# Patient Record
Sex: Male | Born: 1950
Health system: Southern US, Community
[De-identification: ages and names within clinical notes are randomized; demographics above are authoritative.]

## PROBLEM LIST (undated history)

## (undated) DIAGNOSIS — I10 Essential (primary) hypertension: Secondary | ICD-10-CM

## (undated) DIAGNOSIS — R0683 Snoring: Secondary | ICD-10-CM

## (undated) DIAGNOSIS — E7800 Pure hypercholesterolemia, unspecified: Secondary | ICD-10-CM

## (undated) HISTORY — DX: Pure hypercholesterolemia, unspecified: E78.00

## (undated) HISTORY — DX: Snoring: R06.83

## (undated) HISTORY — DX: Essential (primary) hypertension: I10

---

## 2015-03-05 DIAGNOSIS — H40013 Open angle with borderline findings, low risk, bilateral: Secondary | ICD-10-CM | POA: Diagnosis not present

## 2015-05-04 DIAGNOSIS — I10 Essential (primary) hypertension: Secondary | ICD-10-CM | POA: Diagnosis not present

## 2015-05-04 DIAGNOSIS — Z125 Encounter for screening for malignant neoplasm of prostate: Secondary | ICD-10-CM | POA: Diagnosis not present

## 2015-05-11 DIAGNOSIS — R5383 Other fatigue: Secondary | ICD-10-CM | POA: Diagnosis not present

## 2015-05-11 DIAGNOSIS — I129 Hypertensive chronic kidney disease with stage 1 through stage 4 chronic kidney disease, or unspecified chronic kidney disease: Secondary | ICD-10-CM | POA: Diagnosis not present

## 2015-05-11 DIAGNOSIS — Z6827 Body mass index (BMI) 27.0-27.9, adult: Secondary | ICD-10-CM | POA: Diagnosis not present

## 2015-05-11 DIAGNOSIS — E782 Mixed hyperlipidemia: Secondary | ICD-10-CM | POA: Diagnosis not present

## 2015-05-11 DIAGNOSIS — I10 Essential (primary) hypertension: Secondary | ICD-10-CM | POA: Diagnosis not present

## 2015-05-11 DIAGNOSIS — Z1389 Encounter for screening for other disorder: Secondary | ICD-10-CM | POA: Diagnosis not present

## 2015-05-11 DIAGNOSIS — N183 Chronic kidney disease, stage 3 (moderate): Secondary | ICD-10-CM | POA: Diagnosis not present

## 2015-05-11 DIAGNOSIS — Z0000 Encounter for general adult medical examination without abnormal findings: Secondary | ICD-10-CM | POA: Diagnosis not present

## 2015-05-11 DIAGNOSIS — E663 Overweight: Secondary | ICD-10-CM | POA: Diagnosis not present

## 2015-05-11 DIAGNOSIS — Z23 Encounter for immunization: Secondary | ICD-10-CM | POA: Diagnosis not present

## 2015-06-30 DIAGNOSIS — D485 Neoplasm of uncertain behavior of skin: Secondary | ICD-10-CM | POA: Diagnosis not present

## 2015-06-30 DIAGNOSIS — C44311 Basal cell carcinoma of skin of nose: Secondary | ICD-10-CM | POA: Diagnosis not present

## 2015-06-30 DIAGNOSIS — D1801 Hemangioma of skin and subcutaneous tissue: Secondary | ICD-10-CM | POA: Diagnosis not present

## 2015-06-30 DIAGNOSIS — L821 Other seborrheic keratosis: Secondary | ICD-10-CM | POA: Diagnosis not present

## 2015-06-30 DIAGNOSIS — Z85828 Personal history of other malignant neoplasm of skin: Secondary | ICD-10-CM | POA: Diagnosis not present

## 2015-08-05 DIAGNOSIS — Z85828 Personal history of other malignant neoplasm of skin: Secondary | ICD-10-CM | POA: Diagnosis not present

## 2015-08-05 DIAGNOSIS — C44311 Basal cell carcinoma of skin of nose: Secondary | ICD-10-CM | POA: Diagnosis not present

## 2015-08-19 DIAGNOSIS — Z4802 Encounter for removal of sutures: Secondary | ICD-10-CM | POA: Diagnosis not present

## 2015-09-07 DIAGNOSIS — H40013 Open angle with borderline findings, low risk, bilateral: Secondary | ICD-10-CM | POA: Diagnosis not present

## 2016-03-10 DIAGNOSIS — L821 Other seborrheic keratosis: Secondary | ICD-10-CM | POA: Diagnosis not present

## 2016-03-10 DIAGNOSIS — D2239 Melanocytic nevi of other parts of face: Secondary | ICD-10-CM | POA: Diagnosis not present

## 2016-03-10 DIAGNOSIS — L570 Actinic keratosis: Secondary | ICD-10-CM | POA: Diagnosis not present

## 2016-03-10 DIAGNOSIS — D1801 Hemangioma of skin and subcutaneous tissue: Secondary | ICD-10-CM | POA: Diagnosis not present

## 2016-03-10 DIAGNOSIS — D485 Neoplasm of uncertain behavior of skin: Secondary | ICD-10-CM | POA: Diagnosis not present

## 2016-03-10 DIAGNOSIS — Z85828 Personal history of other malignant neoplasm of skin: Secondary | ICD-10-CM | POA: Diagnosis not present

## 2016-03-14 DIAGNOSIS — H2513 Age-related nuclear cataract, bilateral: Secondary | ICD-10-CM | POA: Diagnosis not present

## 2016-03-14 DIAGNOSIS — H40013 Open angle with borderline findings, low risk, bilateral: Secondary | ICD-10-CM | POA: Diagnosis not present

## 2016-06-27 DIAGNOSIS — I10 Essential (primary) hypertension: Secondary | ICD-10-CM | POA: Diagnosis not present

## 2016-06-27 DIAGNOSIS — Z125 Encounter for screening for malignant neoplasm of prostate: Secondary | ICD-10-CM | POA: Diagnosis not present

## 2016-06-27 DIAGNOSIS — E782 Mixed hyperlipidemia: Secondary | ICD-10-CM | POA: Diagnosis not present

## 2016-06-27 DIAGNOSIS — Z79899 Other long term (current) drug therapy: Secondary | ICD-10-CM | POA: Diagnosis not present

## 2016-07-04 DIAGNOSIS — M25473 Effusion, unspecified ankle: Secondary | ICD-10-CM | POA: Diagnosis not present

## 2016-07-04 DIAGNOSIS — E663 Overweight: Secondary | ICD-10-CM | POA: Diagnosis not present

## 2016-07-04 DIAGNOSIS — I10 Essential (primary) hypertension: Secondary | ICD-10-CM | POA: Diagnosis not present

## 2016-07-04 DIAGNOSIS — Z6829 Body mass index (BMI) 29.0-29.9, adult: Secondary | ICD-10-CM | POA: Diagnosis not present

## 2016-07-04 DIAGNOSIS — I129 Hypertensive chronic kidney disease with stage 1 through stage 4 chronic kidney disease, or unspecified chronic kidney disease: Secondary | ICD-10-CM | POA: Diagnosis not present

## 2016-07-04 DIAGNOSIS — Z23 Encounter for immunization: Secondary | ICD-10-CM | POA: Diagnosis not present

## 2016-07-04 DIAGNOSIS — E782 Mixed hyperlipidemia: Secondary | ICD-10-CM | POA: Diagnosis not present

## 2016-07-04 DIAGNOSIS — R5383 Other fatigue: Secondary | ICD-10-CM | POA: Diagnosis not present

## 2016-07-04 DIAGNOSIS — Z1389 Encounter for screening for other disorder: Secondary | ICD-10-CM | POA: Diagnosis not present

## 2016-07-04 DIAGNOSIS — N183 Chronic kidney disease, stage 3 (moderate): Secondary | ICD-10-CM | POA: Diagnosis not present

## 2016-07-04 DIAGNOSIS — Z0000 Encounter for general adult medical examination without abnormal findings: Secondary | ICD-10-CM | POA: Diagnosis not present

## 2016-09-29 DIAGNOSIS — H40013 Open angle with borderline findings, low risk, bilateral: Secondary | ICD-10-CM | POA: Diagnosis not present

## 2016-12-21 DIAGNOSIS — Z6829 Body mass index (BMI) 29.0-29.9, adult: Secondary | ICD-10-CM | POA: Diagnosis not present

## 2016-12-21 DIAGNOSIS — I10 Essential (primary) hypertension: Secondary | ICD-10-CM | POA: Diagnosis not present

## 2016-12-21 DIAGNOSIS — R05 Cough: Secondary | ICD-10-CM | POA: Diagnosis not present

## 2017-03-14 DIAGNOSIS — D485 Neoplasm of uncertain behavior of skin: Secondary | ICD-10-CM | POA: Diagnosis not present

## 2017-03-14 DIAGNOSIS — C44319 Basal cell carcinoma of skin of other parts of face: Secondary | ICD-10-CM | POA: Diagnosis not present

## 2017-03-14 DIAGNOSIS — L570 Actinic keratosis: Secondary | ICD-10-CM | POA: Diagnosis not present

## 2017-03-14 DIAGNOSIS — L821 Other seborrheic keratosis: Secondary | ICD-10-CM | POA: Diagnosis not present

## 2017-03-14 DIAGNOSIS — Z85828 Personal history of other malignant neoplasm of skin: Secondary | ICD-10-CM | POA: Diagnosis not present

## 2017-04-24 DIAGNOSIS — Z85828 Personal history of other malignant neoplasm of skin: Secondary | ICD-10-CM | POA: Diagnosis not present

## 2017-04-24 DIAGNOSIS — C44319 Basal cell carcinoma of skin of other parts of face: Secondary | ICD-10-CM | POA: Diagnosis not present

## 2017-04-27 DIAGNOSIS — M25561 Pain in right knee: Secondary | ICD-10-CM | POA: Diagnosis not present

## 2017-07-05 DIAGNOSIS — Z125 Encounter for screening for malignant neoplasm of prostate: Secondary | ICD-10-CM | POA: Diagnosis not present

## 2017-07-05 DIAGNOSIS — I10 Essential (primary) hypertension: Secondary | ICD-10-CM | POA: Diagnosis not present

## 2017-07-05 DIAGNOSIS — R82998 Other abnormal findings in urine: Secondary | ICD-10-CM | POA: Diagnosis not present

## 2017-07-05 DIAGNOSIS — E782 Mixed hyperlipidemia: Secondary | ICD-10-CM | POA: Diagnosis not present

## 2017-07-12 DIAGNOSIS — M25561 Pain in right knee: Secondary | ICD-10-CM | POA: Diagnosis not present

## 2017-07-12 DIAGNOSIS — I10 Essential (primary) hypertension: Secondary | ICD-10-CM | POA: Diagnosis not present

## 2017-07-12 DIAGNOSIS — E663 Overweight: Secondary | ICD-10-CM | POA: Diagnosis not present

## 2017-07-12 DIAGNOSIS — Z0000 Encounter for general adult medical examination without abnormal findings: Secondary | ICD-10-CM | POA: Diagnosis not present

## 2017-07-12 DIAGNOSIS — Z1389 Encounter for screening for other disorder: Secondary | ICD-10-CM | POA: Diagnosis not present

## 2017-07-12 DIAGNOSIS — E782 Mixed hyperlipidemia: Secondary | ICD-10-CM | POA: Diagnosis not present

## 2017-07-12 DIAGNOSIS — M7731 Calcaneal spur, right foot: Secondary | ICD-10-CM | POA: Diagnosis not present

## 2017-07-12 DIAGNOSIS — Z23 Encounter for immunization: Secondary | ICD-10-CM | POA: Diagnosis not present

## 2017-07-12 DIAGNOSIS — Z6828 Body mass index (BMI) 28.0-28.9, adult: Secondary | ICD-10-CM | POA: Diagnosis not present

## 2017-07-12 DIAGNOSIS — I129 Hypertensive chronic kidney disease with stage 1 through stage 4 chronic kidney disease, or unspecified chronic kidney disease: Secondary | ICD-10-CM | POA: Diagnosis not present

## 2017-07-12 DIAGNOSIS — N183 Chronic kidney disease, stage 3 (moderate): Secondary | ICD-10-CM | POA: Diagnosis not present

## 2018-01-15 DIAGNOSIS — L570 Actinic keratosis: Secondary | ICD-10-CM | POA: Diagnosis not present

## 2018-01-15 DIAGNOSIS — L821 Other seborrheic keratosis: Secondary | ICD-10-CM | POA: Diagnosis not present

## 2018-01-15 DIAGNOSIS — Z85828 Personal history of other malignant neoplasm of skin: Secondary | ICD-10-CM | POA: Diagnosis not present

## 2018-01-15 DIAGNOSIS — L814 Other melanin hyperpigmentation: Secondary | ICD-10-CM | POA: Diagnosis not present

## 2018-04-17 DIAGNOSIS — H25013 Cortical age-related cataract, bilateral: Secondary | ICD-10-CM | POA: Diagnosis not present

## 2018-04-17 DIAGNOSIS — H2513 Age-related nuclear cataract, bilateral: Secondary | ICD-10-CM | POA: Diagnosis not present

## 2018-04-17 DIAGNOSIS — H40023 Open angle with borderline findings, high risk, bilateral: Secondary | ICD-10-CM | POA: Diagnosis not present

## 2018-04-17 DIAGNOSIS — H35363 Drusen (degenerative) of macula, bilateral: Secondary | ICD-10-CM | POA: Diagnosis not present

## 2018-07-11 DIAGNOSIS — N183 Chronic kidney disease, stage 3 (moderate): Secondary | ICD-10-CM | POA: Diagnosis not present

## 2018-07-11 DIAGNOSIS — Z125 Encounter for screening for malignant neoplasm of prostate: Secondary | ICD-10-CM | POA: Diagnosis not present

## 2018-07-11 DIAGNOSIS — R82998 Other abnormal findings in urine: Secondary | ICD-10-CM | POA: Diagnosis not present

## 2018-07-11 DIAGNOSIS — E782 Mixed hyperlipidemia: Secondary | ICD-10-CM | POA: Diagnosis not present

## 2018-07-11 DIAGNOSIS — I10 Essential (primary) hypertension: Secondary | ICD-10-CM | POA: Diagnosis not present

## 2018-07-16 DIAGNOSIS — I129 Hypertensive chronic kidney disease with stage 1 through stage 4 chronic kidney disease, or unspecified chronic kidney disease: Secondary | ICD-10-CM | POA: Diagnosis not present

## 2018-07-16 DIAGNOSIS — Z0000 Encounter for general adult medical examination without abnormal findings: Secondary | ICD-10-CM | POA: Diagnosis not present

## 2018-07-16 DIAGNOSIS — E663 Overweight: Secondary | ICD-10-CM | POA: Diagnosis not present

## 2018-07-16 DIAGNOSIS — N183 Chronic kidney disease, stage 3 (moderate): Secondary | ICD-10-CM | POA: Diagnosis not present

## 2018-07-16 DIAGNOSIS — M25561 Pain in right knee: Secondary | ICD-10-CM | POA: Diagnosis not present

## 2018-07-16 DIAGNOSIS — I10 Essential (primary) hypertension: Secondary | ICD-10-CM | POA: Diagnosis not present

## 2018-07-16 DIAGNOSIS — Z1331 Encounter for screening for depression: Secondary | ICD-10-CM | POA: Diagnosis not present

## 2018-07-16 DIAGNOSIS — E782 Mixed hyperlipidemia: Secondary | ICD-10-CM | POA: Diagnosis not present

## 2018-07-16 DIAGNOSIS — M7731 Calcaneal spur, right foot: Secondary | ICD-10-CM | POA: Diagnosis not present

## 2019-01-16 DIAGNOSIS — D1801 Hemangioma of skin and subcutaneous tissue: Secondary | ICD-10-CM | POA: Diagnosis not present

## 2019-01-16 DIAGNOSIS — L570 Actinic keratosis: Secondary | ICD-10-CM | POA: Diagnosis not present

## 2019-01-16 DIAGNOSIS — D485 Neoplasm of uncertain behavior of skin: Secondary | ICD-10-CM | POA: Diagnosis not present

## 2019-01-16 DIAGNOSIS — C44311 Basal cell carcinoma of skin of nose: Secondary | ICD-10-CM | POA: Diagnosis not present

## 2019-01-16 DIAGNOSIS — C44319 Basal cell carcinoma of skin of other parts of face: Secondary | ICD-10-CM | POA: Diagnosis not present

## 2019-01-16 DIAGNOSIS — Z85828 Personal history of other malignant neoplasm of skin: Secondary | ICD-10-CM | POA: Diagnosis not present

## 2019-01-16 DIAGNOSIS — L821 Other seborrheic keratosis: Secondary | ICD-10-CM | POA: Diagnosis not present

## 2019-01-16 DIAGNOSIS — D044 Carcinoma in situ of skin of scalp and neck: Secondary | ICD-10-CM | POA: Diagnosis not present

## 2019-01-16 DIAGNOSIS — C4441 Basal cell carcinoma of skin of scalp and neck: Secondary | ICD-10-CM | POA: Diagnosis not present

## 2019-02-14 DIAGNOSIS — Z85828 Personal history of other malignant neoplasm of skin: Secondary | ICD-10-CM | POA: Diagnosis not present

## 2019-02-14 DIAGNOSIS — C441191 Basal cell carcinoma of skin of left upper eyelid, including canthus: Secondary | ICD-10-CM | POA: Diagnosis not present

## 2019-02-21 DIAGNOSIS — C4441 Basal cell carcinoma of skin of scalp and neck: Secondary | ICD-10-CM | POA: Diagnosis not present

## 2019-02-21 DIAGNOSIS — Z85828 Personal history of other malignant neoplasm of skin: Secondary | ICD-10-CM | POA: Diagnosis not present

## 2019-02-21 DIAGNOSIS — L988 Other specified disorders of the skin and subcutaneous tissue: Secondary | ICD-10-CM | POA: Diagnosis not present

## 2019-03-09 ENCOUNTER — Ambulatory Visit: Payer: PPO | Attending: Internal Medicine

## 2019-03-09 DIAGNOSIS — Z23 Encounter for immunization: Secondary | ICD-10-CM | POA: Insufficient documentation

## 2019-03-09 NOTE — Progress Notes (Signed)
   Covid-19 Vaccination Clinic  Name:  Jeffrey Bentley    MRN: [id number] DOB: 1950/02/23  03/09/2019  Jeffrey Bentley was observed post Covid-19 immunization for 15 minutes without incidence. He was provided with Vaccine Information Sheet and instruction to access the V-Safe system.   Jeffrey Bentley was instructed to call 911 with any severe reactions post vaccine: Marland Kitchen Difficulty breathing  . Swelling of your face and throat  . A fast heartbeat  . A bad rash all over your body  . Dizziness and weakness    Immunizations Administered    Name Date Dose VIS Date Route   Pfizer COVID-19 Vaccine 03/09/2019 11:22 AM 0.3 mL 01/25/2019 Intramuscular   Manufacturer: [place]   Lot: [id number]   [place]: [id number]

## 2019-03-30 ENCOUNTER — Ambulatory Visit: Payer: PPO | Attending: Internal Medicine

## 2019-03-30 DIAGNOSIS — Z23 Encounter for immunization: Secondary | ICD-10-CM

## 2019-03-30 NOTE — Progress Notes (Signed)
   Covid-19 Vaccination Clinic  Name:  Jeffrey Bentley    MRN: [id number] DOB: July 15, 1950  03/30/2019  Mr. Jeffrey Bentley was observed post Covid-19 immunization for 15 minutes without incidence. He was provided with Vaccine Information Sheet and instruction to access the V-Safe system.   Mr. Jeffrey Bentley was instructed to call 911 with any severe reactions post vaccine: Marland Kitchen Difficulty breathing  . Swelling of your face and throat  . A fast heartbeat  . A bad rash all over your body  . Dizziness and weakness    Immunizations Administered    Name Date Dose VIS Date Route   Pfizer COVID-19 Vaccine 03/30/2019  9:31 AM 0.3 mL 01/25/2019 Intramuscular   Manufacturer: [place]   Lot: [id number]   [place]: [id number]

## 2019-04-18 DIAGNOSIS — H25013 Cortical age-related cataract, bilateral: Secondary | ICD-10-CM | POA: Diagnosis not present

## 2019-04-18 DIAGNOSIS — H2513 Age-related nuclear cataract, bilateral: Secondary | ICD-10-CM | POA: Diagnosis not present

## 2019-04-18 DIAGNOSIS — H35363 Drusen (degenerative) of macula, bilateral: Secondary | ICD-10-CM | POA: Diagnosis not present

## 2019-04-18 DIAGNOSIS — H40023 Open angle with borderline findings, high risk, bilateral: Secondary | ICD-10-CM | POA: Diagnosis not present

## 2019-07-18 DIAGNOSIS — E782 Mixed hyperlipidemia: Secondary | ICD-10-CM | POA: Diagnosis not present

## 2019-07-18 DIAGNOSIS — Z125 Encounter for screening for malignant neoplasm of prostate: Secondary | ICD-10-CM | POA: Diagnosis not present

## 2019-07-22 DIAGNOSIS — N183 Chronic kidney disease, stage 3 unspecified: Secondary | ICD-10-CM | POA: Diagnosis not present

## 2019-07-22 DIAGNOSIS — E782 Mixed hyperlipidemia: Secondary | ICD-10-CM | POA: Diagnosis not present

## 2019-07-22 DIAGNOSIS — Z0000 Encounter for general adult medical examination without abnormal findings: Secondary | ICD-10-CM | POA: Diagnosis not present

## 2019-07-22 DIAGNOSIS — Z1331 Encounter for screening for depression: Secondary | ICD-10-CM | POA: Diagnosis not present

## 2019-07-22 DIAGNOSIS — R252 Cramp and spasm: Secondary | ICD-10-CM | POA: Diagnosis not present

## 2019-07-22 DIAGNOSIS — R82998 Other abnormal findings in urine: Secondary | ICD-10-CM | POA: Diagnosis not present

## 2019-07-22 DIAGNOSIS — I129 Hypertensive chronic kidney disease with stage 1 through stage 4 chronic kidney disease, or unspecified chronic kidney disease: Secondary | ICD-10-CM | POA: Diagnosis not present

## 2019-07-22 DIAGNOSIS — E663 Overweight: Secondary | ICD-10-CM | POA: Diagnosis not present

## 2019-12-24 ENCOUNTER — Ambulatory Visit: Payer: PPO

## 2020-01-22 DIAGNOSIS — D485 Neoplasm of uncertain behavior of skin: Secondary | ICD-10-CM | POA: Diagnosis not present

## 2020-01-22 DIAGNOSIS — L280 Lichen simplex chronicus: Secondary | ICD-10-CM | POA: Diagnosis not present

## 2020-01-22 DIAGNOSIS — L821 Other seborrheic keratosis: Secondary | ICD-10-CM | POA: Diagnosis not present

## 2020-01-22 DIAGNOSIS — C44319 Basal cell carcinoma of skin of other parts of face: Secondary | ICD-10-CM | POA: Diagnosis not present

## 2020-01-22 DIAGNOSIS — Z85828 Personal history of other malignant neoplasm of skin: Secondary | ICD-10-CM | POA: Diagnosis not present

## 2020-01-22 DIAGNOSIS — L570 Actinic keratosis: Secondary | ICD-10-CM | POA: Diagnosis not present

## 2020-04-15 DIAGNOSIS — F419 Anxiety disorder, unspecified: Secondary | ICD-10-CM | POA: Diagnosis not present

## 2020-04-15 DIAGNOSIS — Z125 Encounter for screening for malignant neoplasm of prostate: Secondary | ICD-10-CM | POA: Diagnosis not present

## 2020-04-15 DIAGNOSIS — I10 Essential (primary) hypertension: Secondary | ICD-10-CM | POA: Diagnosis not present

## 2020-04-15 DIAGNOSIS — R82998 Other abnormal findings in urine: Secondary | ICD-10-CM | POA: Diagnosis not present

## 2020-04-15 DIAGNOSIS — N1832 Chronic kidney disease, stage 3b: Secondary | ICD-10-CM | POA: Diagnosis not present

## 2020-04-15 DIAGNOSIS — E782 Mixed hyperlipidemia: Secondary | ICD-10-CM | POA: Diagnosis not present

## 2020-04-15 DIAGNOSIS — E663 Overweight: Secondary | ICD-10-CM | POA: Diagnosis not present

## 2020-04-15 DIAGNOSIS — R4586 Emotional lability: Secondary | ICD-10-CM | POA: Diagnosis not present

## 2020-04-15 DIAGNOSIS — I129 Hypertensive chronic kidney disease with stage 1 through stage 4 chronic kidney disease, or unspecified chronic kidney disease: Secondary | ICD-10-CM | POA: Diagnosis not present

## 2020-04-16 ENCOUNTER — Other Ambulatory Visit: Payer: Self-pay | Admitting: Internal Medicine

## 2020-04-16 DIAGNOSIS — R4586 Emotional lability: Secondary | ICD-10-CM

## 2020-04-20 DIAGNOSIS — H25013 Cortical age-related cataract, bilateral: Secondary | ICD-10-CM | POA: Diagnosis not present

## 2020-04-20 DIAGNOSIS — H40023 Open angle with borderline findings, high risk, bilateral: Secondary | ICD-10-CM | POA: Diagnosis not present

## 2020-04-20 DIAGNOSIS — H2513 Age-related nuclear cataract, bilateral: Secondary | ICD-10-CM | POA: Diagnosis not present

## 2020-04-20 DIAGNOSIS — H524 Presbyopia: Secondary | ICD-10-CM | POA: Diagnosis not present

## 2020-04-20 DIAGNOSIS — H35363 Drusen (degenerative) of macula, bilateral: Secondary | ICD-10-CM | POA: Diagnosis not present

## 2020-05-06 ENCOUNTER — Ambulatory Visit
Admission: RE | Admit: 2020-05-06 | Discharge: 2020-05-06 | Disposition: A | Discharge status: Home / Self Care | Payer: PPO | Source: Ambulatory Visit | Attending: Internal Medicine | Admitting: Internal Medicine

## 2020-05-06 DIAGNOSIS — R4586 Emotional lability: Secondary | ICD-10-CM

## 2020-05-06 DIAGNOSIS — R413 Other amnesia: Secondary | ICD-10-CM | POA: Diagnosis not present

## 2020-05-06 DIAGNOSIS — I639 Cerebral infarction, unspecified: Secondary | ICD-10-CM | POA: Diagnosis not present

## 2020-05-06 MED ORDER — GADOBENATE DIMEGLUMINE 529 MG/ML IV SOLN
20.0000 mL | Freq: Once | INTRAVENOUS | Status: AC | PRN
  Administered 2020-05-06: 20 mL via INTRAVENOUS

## 2020-05-20 DIAGNOSIS — R4586 Emotional lability: Secondary | ICD-10-CM | POA: Diagnosis not present

## 2020-05-20 DIAGNOSIS — N1832 Chronic kidney disease, stage 3b: Secondary | ICD-10-CM | POA: Diagnosis not present

## 2020-05-20 DIAGNOSIS — E782 Mixed hyperlipidemia: Secondary | ICD-10-CM | POA: Diagnosis not present

## 2020-05-20 DIAGNOSIS — I129 Hypertensive chronic kidney disease with stage 1 through stage 4 chronic kidney disease, or unspecified chronic kidney disease: Secondary | ICD-10-CM | POA: Diagnosis not present

## 2020-07-02 DIAGNOSIS — Z1152 Encounter for screening for COVID-19: Secondary | ICD-10-CM | POA: Diagnosis not present

## 2020-07-02 DIAGNOSIS — J0110 Acute frontal sinusitis, unspecified: Secondary | ICD-10-CM | POA: Diagnosis not present

## 2020-07-02 DIAGNOSIS — R059 Cough, unspecified: Secondary | ICD-10-CM | POA: Diagnosis not present

## 2020-07-02 DIAGNOSIS — J988 Other specified respiratory disorders: Secondary | ICD-10-CM | POA: Diagnosis not present

## 2020-07-29 DIAGNOSIS — Z1339 Encounter for screening examination for other mental health and behavioral disorders: Secondary | ICD-10-CM | POA: Diagnosis not present

## 2020-07-29 DIAGNOSIS — I129 Hypertensive chronic kidney disease with stage 1 through stage 4 chronic kidney disease, or unspecified chronic kidney disease: Secondary | ICD-10-CM | POA: Diagnosis not present

## 2020-07-29 DIAGNOSIS — N1832 Chronic kidney disease, stage 3b: Secondary | ICD-10-CM | POA: Diagnosis not present

## 2020-07-29 DIAGNOSIS — E663 Overweight: Secondary | ICD-10-CM | POA: Diagnosis not present

## 2020-07-29 DIAGNOSIS — Z1331 Encounter for screening for depression: Secondary | ICD-10-CM | POA: Diagnosis not present

## 2020-07-29 DIAGNOSIS — E782 Mixed hyperlipidemia: Secondary | ICD-10-CM | POA: Diagnosis not present

## 2020-07-29 DIAGNOSIS — Z0000 Encounter for general adult medical examination without abnormal findings: Secondary | ICD-10-CM | POA: Diagnosis not present

## 2020-07-29 DIAGNOSIS — R400 Somnolence: Secondary | ICD-10-CM | POA: Diagnosis not present

## 2020-08-10 ENCOUNTER — Ambulatory Visit: Payer: PPO | Admitting: Neurology

## 2020-08-10 ENCOUNTER — Encounter: Payer: Self-pay | Admitting: Neurology

## 2020-08-10 VITALS — BP 130/84 | HR 89 | Ht 72.0 in | Wt 220.0 lb

## 2020-08-10 DIAGNOSIS — R351 Nocturia: Secondary | ICD-10-CM | POA: Diagnosis not present

## 2020-08-10 DIAGNOSIS — G4733 Obstructive sleep apnea (adult) (pediatric): Secondary | ICD-10-CM

## 2020-08-10 DIAGNOSIS — E663 Overweight: Secondary | ICD-10-CM | POA: Diagnosis not present

## 2020-08-10 DIAGNOSIS — R0681 Apnea, not elsewhere classified: Secondary | ICD-10-CM | POA: Diagnosis not present

## 2020-08-10 NOTE — Patient Instructions (Signed)

## 2020-08-10 NOTE — Progress Notes (Signed)
Subjective:    Patient ID: Jeffrey Bentley is a 70 y.o. male.  HPI    Star Age, MD, PhD [facility] [address] [address] [place], [place] [address]  Dear Dr. Reynaldo Minium,   I saw your patient, Jeffrey Bentley, upon your kind request in my sleep clinic today for initial consultation of his sleep disorder, in particular, concern for underlying obstructive sleep apnea. The patient is unaccompanied today. As you know, Jeffrey Bentley is a 70 year old right-handed gentleman with an underlying medical history of hypertension, hyperlipidemia, chronic kidney disease, and overweight state, who reports snoring and witnessed apneas .  I reviewed your office note from 07/29/2020.  He had blood work through your office for 04/15/2020 and I was able to review the results: CMP was unremarkable with the exception of slightly elevated glucose at 119, CBC with differential unremarkable, lipid panel unremarkable, LDL was 86 and HDL was 38.  TSH normal at 1.23, free T4 normal, PSA normal at 2.482, B12 437, RPR nonreactive.  His Epworth sleepiness score is 5 out of 24, fatigue severity score is 22 out of 63.  He had a home sleep test some 30 years ago and was diagnosed with sleep apnea at the time.  He tried home CPAP therapy.  He could not tolerate the pressure or the mask at the time but does not recall exactly what specific issues he had as it was so long ago.  He reports that his wife still notices apneic pauses while he is asleep.  He tries to be in bed between 1030 and 11 PM and rise time is typically between 630 and 7:30 AM.  He is married and lives with his wife, they have 1 dog in the household.  He has 3 grown children, 1 grandchild and 1 on the way.  He drinks caffeine in the form of coffee, typically 2 cups in the mornings and diet Dr. Malachi Bonds about 3 cups/day currently.  He is in the process of reducing his diet soda intake.  Weight has generally been more or less stable.  He does  not have a family history of sleep apnea.  He has nocturia about once per average night and denies recurrent morning headaches.  He does have a TV in the bedroom but typically does not watch it in his bedroom.  Sometimes he has to sleep in a separate bedroom because the snoring is bothersome to his wife.  Years ago he saw ENT and was told that he had a deviated septum.  Surgery was discussed but not covered by the insurance and he did not pursue it at the time.  He is a non-smoker, he drinks alcohol occasionally.  He is retired as a Tree surgeon.  His  Past Medical History:  Diagnosis Date   High blood pressure    High cholesterol    Snoring     His Past Surgical History Is Significant For: History reviewed. No pertinent surgical history.  His Family History Is Significant For: History reviewed. No pertinent family history.  His Social History Is Significant For: Social History   Socioeconomic History   Marital status: Married    Spouse name: Not on file   Number of children: Not on file   Years of education: Not on file   Highest education level: Not on file  Occupational History   Not on file  Tobacco Use   Smoking status: Former    Pack years: 0.00  Types: Cigarettes   Smokeless tobacco: Never  Substance and Sexual Activity   Alcohol use: Yes    Comment: Seldom   Drug use: Never   Sexual activity: Not on file  Other Topics Concern   Not on file  Social History Narrative   Not on file   Social Determinants of Health   Financial Resource Strain: Not on file  Food Insecurity: Not on file  Transportation Needs: Not on file  Physical Activity: Not on file  Stress: Not on file  Social Connections: Not on file    His Allergies Are:  No Known Allergies:   His Current Medications Are:  Outpatient Encounter Medications as of 08/10/2020  Medication Sig   atorvastatin (LIPITOR) 20 MG tablet Take 20 mg by mouth daily.   losartan (COZAAR) 50 MG tablet Take 50 mg by  mouth 2 (two) times daily.   sertraline (ZOLOFT) 50 MG tablet Take 50 mg by mouth daily.   No facility-administered encounter medications on file as of 08/10/2020.  :   Review of Systems:  Out of a complete 14 point review of systems, all are reviewed and negative with the exception of these symptoms as listed below:  Review of Systems  Neurological:        Here for sleep consult. Sts he had a sleep study 30+ years ago and did try cpap but could not tolerate the mask at night. Snoring is present.  Epworth Sleepiness Scale 0= would never doze 1= slight chance of dozing 2= moderate chance of dozing 3= high chance of dozing  Sitting and reading:1 Watching TV:1 Sitting inactive in a public place (ex. Theater or meeting):0 As a passenger in a car for an hour without a break:0 Lying down to rest in the afternoon:3 Sitting and talking to someone:0 Sitting quietly after lunch (no alcohol):0 In a car, while stopped in traffic:0 Total:5    Objective:  Neurological Exam  Physical Exam Physical Examination:   Vitals:   08/10/20 1451  BP: 130/84  Pulse: 89    General Examination: The patient is a very pleasant 70 y.o. male in no acute distress. He appears well-developed and well-nourished and well groomed.   HEENT: Normocephalic, atraumatic, pupils are equal, round and reactive to light, extraocular tracking is good without limitation to gaze excursion or nystagmus noted. Hearing is grossly intact. Face is symmetric with normal facial animation. Speech is clear with no dysarthria noted. There is no hypophonia. There is no lip, neck/head, jaw or voice tremor. Neck is supple with full range of passive and active motion. There are no carotid bruits on auscultation. Oropharynx exam reveals: mild mouth dryness, adequate dental hygiene and mild airway crowding, due to smaller airway entry and redundant soft palate.  Tonsils on the smaller side.  Tongue protrudes centrally and palate elevates  symmetrically, Mallampati class III.  Neck circumference of 17 inches.  He has a moderate overbite.  Nasal inspection reveals significantly deviated septum to the right.  Chest: Clear to auscultation without wheezing, rhonchi or crackles noted.  Heart: S1+S2+0, regular and normal without murmurs, rubs or gallops noted.   Abdomen: Soft, non-tender and non-distended with normal bowel sounds appreciated on auscultation.  Extremities: There is no pitting edema in the distal lower extremities bilaterally.   Skin: Warm and dry without trophic changes noted.   Musculoskeletal: exam reveals no obvious joint deformities, tenderness or joint swelling or erythema.   Neurologically:  Mental status: The patient is awake, alert and oriented in  all 4 spheres. His immediate and remote memory, attention, language skills and fund of knowledge are appropriate. There is no evidence of aphasia, agnosia, apraxia or anomia. Speech is clear with normal prosody and enunciation. Thought process is linear. Mood is normal and affect is normal.  Cranial nerves II - XII are as described above under HEENT exam.  Motor exam: Normal bulk, strength and tone is noted. There is no tremor, fine motor skills and coordination: grossly intact.  Cerebellar testing: No dysmetria or intention tremor. There is no truncal or gait ataxia.  Sensory exam: intact to light touch in the upper and lower extremities.  Gait, station and balance: He stands easily. No veering to one side is noted. No leaning to one side is noted. Posture is age-appropriate and stance is narrow based. Gait shows normal stride length and normal pace. No problems turning are noted.   Assessment and Plan:  In summary, Jeffrey Bentley is a very pleasant 70 y.o.-year old male with an underlying medical history of hypertension, hyperlipidemia, chronic kidney disease, and overweight state, whose history and physical exam are concerning for obstructive sleep apnea (OSA). I  had a long chat with the patient about my findings and the diagnosis of OSA, its prognosis and treatment options. We talked about medical treatments, surgical interventions and non-pharmacological approaches. I explained in particular the risks and ramifications of untreated moderate to severe OSA, especially with respect to developing cardiovascular disease down the Road, including congestive heart failure, difficult to treat hypertension, cardiac arrhythmias, or stroke. Even type 2 diabetes has, in part, been linked to untreated OSA. Symptoms of untreated OSA include daytime sleepiness, memory problems, mood irritability and mood disorder such as depression and anxiety, lack of energy, as well as recurrent headaches, especially morning headaches. We talked about trying to maintain a healthy lifestyle in general, as well as the importance of weight control. We also talked about the importance of good sleep hygiene. I recommended the following at this time: sleep study.   I explained the sleep test procedure to the patient and also outlined possible surgical and non-surgical treatment options of OSA, including the use of a custom-made dental device (which would require a referral to a specialist dentist or oral surgeon), upper airway surgical options, such as traditional UPPP or a novel less invasive surgical option in the form of Inspire hypoglossal nerve stimulation (which would involve a referral to an ENT surgeon). I also explained the CPAP treatment option to the patient, who indicated that he would be willing to try CPAP if the need arises.  We will pick up our discussion after testing is completed.  We will plan to follow-up according to the test results as well.  I answered all his questions today and he was in agreement with the plan.  Thank you very much for allowing me to participate in the care of this nice patient. If I can be of any further assistance to you please do not hesitate to call me at  [phone].  Sincerely,   Star Age, MD, PhD

## 2020-10-28 ENCOUNTER — Other Ambulatory Visit: Payer: Self-pay

## 2020-10-28 ENCOUNTER — Ambulatory Visit (INDEPENDENT_AMBULATORY_CARE_PROVIDER_SITE_OTHER): Payer: PPO | Admitting: Neurology

## 2020-10-28 DIAGNOSIS — G4720 Circadian rhythm sleep disorder, unspecified type: Secondary | ICD-10-CM

## 2020-10-28 DIAGNOSIS — E663 Overweight: Secondary | ICD-10-CM

## 2020-10-28 DIAGNOSIS — R351 Nocturia: Secondary | ICD-10-CM

## 2020-10-28 DIAGNOSIS — R0681 Apnea, not elsewhere classified: Secondary | ICD-10-CM

## 2020-10-28 DIAGNOSIS — G4733 Obstructive sleep apnea (adult) (pediatric): Secondary | ICD-10-CM | POA: Diagnosis not present

## 2020-11-05 NOTE — Addendum Note (Signed)
Addended by: Star Age on: 11/05/2020 05:07 PM   Modules accepted: Orders

## 2020-11-05 NOTE — Procedures (Signed)
nPATIENT'S NAME:  Jeffrey Bentley, Jeffrey Bentley DOB:      [birth year]      MR#:    [id number]     DATE OF RECORDING: 10/28/2020 REFERRING M.D.:  Burnard Bunting, MD Study Performed:   Baseline Polysomnogram HISTORY: 70 year old man with a history of hypertension, hyperlipidemia, chronic kidney disease, and overweight state, who reports snoring and witnessed apneas. He was diagnosed with sleep apnea many years ago and could not tolerate CPAP at the time. The patient endorsed the Epworth Sleepiness Scale at 5 points. The patient's weight 220 pounds with a height of 72 (inches), resulting in a BMI of 29.9 kg/m2. The patient's neck circumference measured 17 inches.  CURRENT MEDICATIONS: Lipitor, Cozaar, Zoloft   PROCEDURE:  This is a multichannel digital polysomnogram utilizing the Somnostar 11.2 system.  Electrodes and sensors were applied and monitored per AASM Specifications.   EEG, EOG, Chin and Limb EMG, were sampled at 200 Hz.  ECG, Snore and Nasal Pressure, Thermal Airflow, Respiratory Effort, CPAP Flow and Pressure, Oximetry was sampled at 50 Hz. Digital video and audio were recorded.      BASELINE STUDY  Lights Out was at 21:49 and Lights On at 05:22.  Total recording time (TRT) was 453 minutes, with a total sleep time (TST) of 313.5 minutes.   The patient's sleep latency was 41 minutes, which is delayed. REM latency was 193.5 minutes, which is delayed. The sleep efficiency was 69.2%.     SLEEP ARCHITECTURE: WASO (Wake after sleep onset) was 98.5 minutes with moderate sleep fragmentation noted.  There were 66.5 minutes in Stage N1, 171.5 minutes Stage N2, 39.5 minutes Stage N3 and 36 minutes in Stage REM.  The percentage of Stage N1 was 21.2%, which is markedly increased, Stage N2 was 54.7%, which is normal, Stage N3 was 12.6% and Stage R (REM sleep) was 11.5%, which is reduced.  The arousals were noted as: 95 were spontaneous, 0 were associated with PLMs, 63 were associated with respiratory  events.  RESPIRATORY ANALYSIS:  There were a total of 110 respiratory events:  42 obstructive apneas, 0 central apneas and 0 mixed apneas with a total of 42 apneas and an apnea index (AI) of 8. /hour. There were 68 hypopneas with a hypopnea index of 13. /hour. The patient also had 0 respiratory event related arousals (RERAs).      The total APNEA/HYPOPNEA INDEX (AHI) was 21.1/hour and the total RESPIRATORY DISTURBANCE INDEX was  21.1 /hour.  25 events occurred in REM sleep and 102 events in NREM. The REM AHI was  41.7 /hour, versus a non-REM AHI of 18.4. The patient spent 47.5 minutes of total sleep time in the supine position and 266 minutes in non-supine. The supine AHI was 24.0 versus a non-supine AHI of 20.5.  OXYGEN SATURATION & C02:  The Wake baseline 02 saturation was 96%, with the lowest being 81%. Time spent below 89% saturation equaled 36 minutes.  PERIODIC LIMB MOVEMENTS: The patient had a total of 0 Periodic Limb Movements.  The Periodic Limb Movement (PLM) index was 0 and the PLM Arousal index was 0/hour.  Audio and video analysis did not show any abnormal or unusual movements, behaviors, phonations or vocalizations. The patient took 1 bathroom break. Moderate snoring was noted. The EKG was in keeping with normal sinus rhythm (NSR).  Post-study, the patient indicated that sleep was the same as usual.   IMPRESSION:  Obstructive Sleep Apnea (OSA) Dysfunctions associated with sleep stages or arousal from sleep  RECOMMENDATIONS:  This study demonstrates moderate to severe obstructive sleep apnea, with a total AHI of 21.1/hour, REM AHI of 41.7/hour, supine AHI of 24/hour and O2 nadir of 81%. Treatment with positive airway pressure in the form of CPAP is recommended. This will require a full night titration study to optimize therapy. Other treatment options may include surgical options in appropriate patients or the use of an oral appliance in certain patients. ENT evaluation and/or  consultation with a maxillofacial surgeon or dentist may be feasible.    Please note that untreated obstructive sleep apnea carries additional perioperative morbidity. Patients with significant obstructive sleep apnea should receive perioperative PAP therapy and the surgeons and particularly the anesthesiologist should be informed of the diagnosis and the severity of the sleep disordered breathing. This study shows sleep fragmentation and abnormal sleep stage percentages; these are nonspecific findings and per se do not signify an intrinsic sleep disorder or a cause for the patient's sleep-related symptoms. Causes include (but are not limited to) the first night effect of the sleep study, circadian rhythm disturbances, medication effect or an underlying mood disorder or medical problem.  The patient should be cautioned not to drive, work at heights, or operate dangerous or heavy equipment when tired or sleepy. Review and reiteration of good sleep hygiene measures should be pursued with any patient. The patient will be seen in follow-up in the sleep clinic at [facility] for discussion of the test results, symptom and treatment compliance review, further management strategies, etc. The referring provider will be notified of the test results.  I certify that I have reviewed the entire raw data recording prior to the issuance of this report in accordance with the Standards of Accreditation of the American Academy of Sleep Medicine (AASM)  Star Age, MD, PhD Diplomat, American Board of Neurology and Sleep Medicine (Neurology and Sleep Medicine)

## 2020-11-09 ENCOUNTER — Telehealth: Payer: Self-pay | Admitting: *Deleted

## 2020-11-09 NOTE — Telephone Encounter (Signed)
Spoke with patient and discussed sleep study results.  He understands the sleep study showed moderate to severe obstructive sleep apnea and the recommended treatment is with a form of CPAP.  He understands this will require a repeat sleep study for proper titration and mask fitting and current monitoring of patient's patient will await a call to schedule once insurance authorization is complete.

## 2020-11-09 NOTE — Telephone Encounter (Signed)
-----   Message from Star Age, MD sent at 11/05/2020  5:07 PM EDT ----- Patient referred by Dr. Reynaldo Minium, seen by me on 08/10/20, diagnostic PSG on 10/28/20.   Please call and notify the patient that the recent sleep study showed moderate to severe obstructive sleep apnea. I recommend treatment for this in the form of CPAP. This will require a repeat sleep study for proper titration and mask fitting and correct monitoring of the oxygen saturations, especially, since he did not have a good experience with CPAP in the (distant) past. Please explain to patient. I have placed an order in the chart. Thanks.  Star Age, MD, PhD [facility])

## 2020-11-11 ENCOUNTER — Encounter: Payer: Self-pay | Admitting: *Deleted

## 2020-11-11 ENCOUNTER — Telehealth: Payer: Self-pay

## 2020-11-11 DIAGNOSIS — G4733 Obstructive sleep apnea (adult) (pediatric): Secondary | ICD-10-CM

## 2020-11-11 NOTE — Telephone Encounter (Signed)
Called pt on mobile # and LVM (ok per DPR) advising insurance not approving in-lab CPAP titration study at this time but recommends autoPAP at home instead. I left the office number in the message for pt to call back and discuss.

## 2020-11-11 NOTE — Telephone Encounter (Signed)
We will set patient up with autoPAP at home, as insurance denied in house titration study for his moderate to severe OSA. Pls process order and notify patient and set up FU in 10 weeks with me or NP.

## 2020-11-11 NOTE — Addendum Note (Signed)
Addended by: Star Age on: 11/11/2020 12:27 PM   Modules accepted: Orders

## 2020-11-11 NOTE — Telephone Encounter (Signed)
Insurance is not allowing for inlab titration study at this time. [employer] is recommending an AutoPAP for patient first. Then will allow CPAP study if patient has difficulty with autopap.

## 2020-11-11 NOTE — Telephone Encounter (Signed)
Patient returned my call.  We discussed the update with the sleep study.  Patient has agreed to start AutoPap at home instead.  He prefers to wait until he receives his machine to call us to schedule an appointment that needs to fall within 30 to 90 days after.  Patient aware insurance will be looking for him to use the machine at least 4 hours every night.  Our goal for him is to use it all night long if possible.  His questions were answered during the call.  He did not have a preference of DME company.  I advised we would send the order over to [employer].  Patient aware he should receive a call within 1 week.  [employer] will be his main contact for technical questions and he is aware that we will fit him with a mask, show him how to use the machine etc. Pt confirmed his address so I could send him a letter summarizing our call.  He verbalized appreciation.  Order sent to [employer] and letter mailed to pt's home.

## 2020-12-23 DIAGNOSIS — R0981 Nasal congestion: Secondary | ICD-10-CM | POA: Diagnosis not present

## 2020-12-23 DIAGNOSIS — Z1152 Encounter for screening for COVID-19: Secondary | ICD-10-CM | POA: Diagnosis not present

## 2020-12-23 DIAGNOSIS — R051 Acute cough: Secondary | ICD-10-CM | POA: Diagnosis not present

## 2020-12-23 DIAGNOSIS — R5383 Other fatigue: Secondary | ICD-10-CM | POA: Diagnosis not present

## 2021-01-21 DIAGNOSIS — L821 Other seborrheic keratosis: Secondary | ICD-10-CM | POA: Diagnosis not present

## 2021-01-21 DIAGNOSIS — L309 Dermatitis, unspecified: Secondary | ICD-10-CM | POA: Diagnosis not present

## 2021-01-21 DIAGNOSIS — Z85828 Personal history of other malignant neoplasm of skin: Secondary | ICD-10-CM | POA: Diagnosis not present

## 2021-01-21 DIAGNOSIS — L570 Actinic keratosis: Secondary | ICD-10-CM | POA: Diagnosis not present

## 2021-02-03 DIAGNOSIS — G4730 Sleep apnea, unspecified: Secondary | ICD-10-CM | POA: Diagnosis not present

## 2021-02-03 DIAGNOSIS — N1832 Chronic kidney disease, stage 3b: Secondary | ICD-10-CM | POA: Diagnosis not present

## 2021-02-03 DIAGNOSIS — I129 Hypertensive chronic kidney disease with stage 1 through stage 4 chronic kidney disease, or unspecified chronic kidney disease: Secondary | ICD-10-CM | POA: Diagnosis not present

## 2021-02-03 DIAGNOSIS — R4586 Emotional lability: Secondary | ICD-10-CM | POA: Diagnosis not present

## 2021-02-03 DIAGNOSIS — E663 Overweight: Secondary | ICD-10-CM | POA: Diagnosis not present

## 2021-02-18 ENCOUNTER — Ambulatory Visit: Payer: PPO | Admitting: Neurology

## 2021-04-08 NOTE — Telephone Encounter (Signed)
Phone rep called and spoke with pt.  Phone rep offered pt the option to come in and discuss alternative treatment options, pt declined.  The adverse effects such as high blood pressure, memory trouble, headaches, and even stroke or heart attack were relayed to pt.  He said he was aware but still declined going any further.  Phone rep thanked pt for his time and the call ended.  No call back requested.

## 2021-04-08 NOTE — Telephone Encounter (Signed)
Noted, thank you for calling.

## 2021-04-08 NOTE — Telephone Encounter (Signed)
Please call the patient and offer patient an appointment to discuss alternative treatment options as we received a courtesy update from [facility] stating he had returned his machine.  If he refuses to schedule an appointment, please kindly inform him that sleep apnea left untreated can lead to adverse effects such as high blood pressure, memory trouble, headaches, and even stroke or heart attack.   ---------------------------------- We received an update from [facility] stating patient has return his Luna G3 machine.  The message states that the patient states he does not like wearing his mask at night and he woke up with a sore throat the first night he used it.  Patient also states he believes he got a respiratory infection from using the machine.  Patient stated he had an appointment with Dr. Rexene Alberts and she knows he is returning the CPAP machine.   Of note, I do not see any notes in patient's chart from machine return and he does not have a pending appointment with Dr. Rexene Alberts at this time.  We will have the office reach out to the patient once to offer to schedule an appointment to discuss alternative treatment options if he would like.

## 2021-04-08 NOTE — Telephone Encounter (Signed)
Noted, thank you. Please fax phone note to PCP to keep him in the loop.

## 2021-04-08 NOTE — Telephone Encounter (Signed)
Done -- faxed to Dr Reynaldo Minium.

## 2021-04-26 DIAGNOSIS — H11153 Pinguecula, bilateral: Secondary | ICD-10-CM | POA: Diagnosis not present

## 2021-04-26 DIAGNOSIS — H524 Presbyopia: Secondary | ICD-10-CM | POA: Diagnosis not present

## 2021-04-26 DIAGNOSIS — H40023 Open angle with borderline findings, high risk, bilateral: Secondary | ICD-10-CM | POA: Diagnosis not present

## 2021-04-26 DIAGNOSIS — H04123 Dry eye syndrome of bilateral lacrimal glands: Secondary | ICD-10-CM | POA: Diagnosis not present

## 2021-04-26 DIAGNOSIS — H25813 Combined forms of age-related cataract, bilateral: Secondary | ICD-10-CM | POA: Diagnosis not present

## 2021-08-04 DIAGNOSIS — R7989 Other specified abnormal findings of blood chemistry: Secondary | ICD-10-CM | POA: Diagnosis not present

## 2021-08-04 DIAGNOSIS — I10 Essential (primary) hypertension: Secondary | ICD-10-CM | POA: Diagnosis not present

## 2021-08-04 DIAGNOSIS — E782 Mixed hyperlipidemia: Secondary | ICD-10-CM | POA: Diagnosis not present

## 2021-08-11 DIAGNOSIS — Z1331 Encounter for screening for depression: Secondary | ICD-10-CM | POA: Diagnosis not present

## 2021-08-11 DIAGNOSIS — N1832 Chronic kidney disease, stage 3b: Secondary | ICD-10-CM | POA: Diagnosis not present

## 2021-08-11 DIAGNOSIS — R7302 Impaired glucose tolerance (oral): Secondary | ICD-10-CM | POA: Diagnosis not present

## 2021-08-11 DIAGNOSIS — R059 Cough, unspecified: Secondary | ICD-10-CM | POA: Diagnosis not present

## 2021-08-11 DIAGNOSIS — I129 Hypertensive chronic kidney disease with stage 1 through stage 4 chronic kidney disease, or unspecified chronic kidney disease: Secondary | ICD-10-CM | POA: Diagnosis not present

## 2021-08-11 DIAGNOSIS — Z1339 Encounter for screening examination for other mental health and behavioral disorders: Secondary | ICD-10-CM | POA: Diagnosis not present

## 2021-08-11 DIAGNOSIS — R4586 Emotional lability: Secondary | ICD-10-CM | POA: Diagnosis not present

## 2021-08-11 DIAGNOSIS — Z0000 Encounter for general adult medical examination without abnormal findings: Secondary | ICD-10-CM | POA: Diagnosis not present

## 2021-08-11 DIAGNOSIS — G4730 Sleep apnea, unspecified: Secondary | ICD-10-CM | POA: Diagnosis not present

## 2021-08-11 DIAGNOSIS — E663 Overweight: Secondary | ICD-10-CM | POA: Diagnosis not present

## 2021-08-11 DIAGNOSIS — E782 Mixed hyperlipidemia: Secondary | ICD-10-CM | POA: Diagnosis not present

## 2021-08-12 DIAGNOSIS — R82998 Other abnormal findings in urine: Secondary | ICD-10-CM | POA: Diagnosis not present

## 2021-12-03 DIAGNOSIS — M47896 Other spondylosis, lumbar region: Secondary | ICD-10-CM | POA: Diagnosis not present

## 2021-12-03 DIAGNOSIS — M5136 Other intervertebral disc degeneration, lumbar region: Secondary | ICD-10-CM | POA: Diagnosis not present

## 2021-12-03 DIAGNOSIS — M545 Low back pain, unspecified: Secondary | ICD-10-CM | POA: Diagnosis not present

## 2021-12-03 DIAGNOSIS — M47816 Spondylosis without myelopathy or radiculopathy, lumbar region: Secondary | ICD-10-CM | POA: Diagnosis not present

## 2021-12-15 DIAGNOSIS — M545 Low back pain, unspecified: Secondary | ICD-10-CM | POA: Diagnosis not present

## 2021-12-16 ENCOUNTER — Telehealth (HOSPITAL_COMMUNITY): Payer: Self-pay | Admitting: Family Medicine

## 2021-12-16 ENCOUNTER — Ambulatory Visit (HOSPITAL_COMMUNITY)
Admission: EM | Admit: 2021-12-16 | Discharge: 2021-12-16 | Disposition: A | Discharge status: Home / Self Care | Payer: PPO | Attending: Family Medicine | Admitting: Family Medicine

## 2021-12-16 ENCOUNTER — Encounter (HOSPITAL_COMMUNITY): Payer: Self-pay | Admitting: *Deleted

## 2021-12-16 DIAGNOSIS — U071 COVID-19: Secondary | ICD-10-CM | POA: Diagnosis not present

## 2021-12-16 MED ORDER — HYDROCODONE BIT-HOMATROP MBR 5-1.5 MG/5ML PO SOLN: 5.0000 mL | Freq: Four times a day (QID) | ORAL | 0 refills | Status: AC | PRN

## 2021-12-16 MED ORDER — HYDROCOD POLI-CHLORPHE POLI ER 10-8 MG/5ML PO SUER: 5.0000 mL | Freq: Two times a day (BID) | ORAL | 0 refills | Status: DC | PRN

## 2021-12-16 MED ORDER — CHERATUSSIN AC 100-10 MG/5ML PO SOLN: 5.0000 mL | Freq: Four times a day (QID) | ORAL | 0 refills | Status: DC | PRN

## 2021-12-16 MED ORDER — NIRMATRELVIR/RITONAVIR (PAXLOVID) TABLET (RENAL DOSING): ORAL_TABLET | ORAL | 0 refills | Status: AC

## 2021-12-16 NOTE — Telephone Encounter (Signed)
Wife called and said cheratussin on back order. Tussionex sent to fill instead.

## 2021-12-16 NOTE — ED Provider Notes (Signed)
Jeffrey Bentley    CSN: [id number] Arrival date & time: 12/16/21  1245      History   Chief Complaint Chief Complaint  Patient presents with   Covid Positive    HPI Jeffrey Bentley is a 71 y.o. male.   HPI Here for sore throat and cough and congestion. Symptoms began yesterday. Subjective fever, but thermometer said no. No n/v/d. No dyspnea. He did a home COVID test today that was positive.   He takes losartan for htn. On review of care everywhere, Stage 3b CKD is in his problem list. I cannot access specific lab results in his chart.  Past Medical History:  Diagnosis Date   High blood pressure    High cholesterol    Snoring     There are no problems to display for this patient.   History reviewed. No pertinent surgical history.     Home Medications    Prior to Admission medications   Medication Sig Start Date End Date Taking? Authorizing Provider  atorvastatin (LIPITOR) 20 MG tablet Take 20 mg by mouth daily. 06/01/20  Yes [provider]  guaiFENesin-codeine (CHERATUSSIN AC) 100-10 MG/5ML syrup Take 5 mLs by mouth 4 (four) times daily as needed for cough. 12/16/21  Yes Barrett Henle, MD  losartan (COZAAR) 50 MG tablet Take 50 mg by mouth 2 (two) times daily. 08/03/20  Yes [provider]  nirmatrelvir/ritonavir EUA, renal dosing, (PAXLOVID) 10 x 150 MG & 10 x '100MG'$  TABS Take nirmatrelvir (150 mg) one tablet twice daily for 5 days and ritonavir (100 mg) one tablet twice daily for 5 days 12/16/21  Yes Tadeo Besecker, Gwenlyn Perking, MD  sertraline (ZOLOFT) 50 MG tablet Take 50 mg by mouth daily. 07/16/20  Yes [provider]    Family History History reviewed. No pertinent family history.  Social History Social History   Tobacco Use   Smoking status: Former    Types: Cigarettes   Smokeless tobacco: Never  Substance Use Topics   Alcohol use: Yes    Comment: Seldom   Drug use: Never     Allergies   Patient has no known  allergies.   Review of Systems Review of Systems   Physical Exam Triage Vital Signs ED Triage Vitals  Enc Vitals Group     BP 12/16/21 1337 (!) 153/89     Pulse Rate 12/16/21 1337 83     Resp 12/16/21 1337 18     Temp 12/16/21 1337 98.9 F (37.2 C)     Temp Source 12/16/21 1337 Oral     SpO2 12/16/21 1337 96 %     Weight --      Height --      Head Circumference --      Peak Flow --      Pain Score 12/16/21 1336 0     Pain Loc --      Pain Edu? --      Excl. in [place]? --    No data found.  Updated Vital Signs BP (!) 153/89 (BP Location: Right Arm)   Pulse 83   Temp 98.9 F (37.2 C) (Oral)   Resp 18   SpO2 96%   Visual Acuity Right Eye Distance:   Left Eye Distance:   Bilateral Distance:    Right Eye Near:   Left Eye Near:    Bilateral Near:     Physical Exam Vitals reviewed.  Constitutional:      General: He is not in  acute distress.    Appearance: He is not ill-appearing, toxic-appearing or diaphoretic.  HENT:     Right Ear: Tympanic membrane and ear canal normal.     Left Ear: Tympanic membrane and ear canal normal.     Nose: Nose normal.     Mouth/Throat:     Mouth: Mucous membranes are moist.     Comments: There is some erythema of the posterior OP  Eyes:     Extraocular Movements: Extraocular movements intact.     Conjunctiva/sclera: Conjunctivae normal.     Pupils: Pupils are equal, round, and reactive to light.  Cardiovascular:     Rate and Rhythm: Normal rate and regular rhythm.     Heart sounds: No murmur heard. Pulmonary:     Effort: No respiratory distress.     Breath sounds: No stridor. No wheezing, rhonchi or rales.  Musculoskeletal:     Cervical back: Neck supple.  Lymphadenopathy:     Cervical: No cervical adenopathy.  Skin:    Capillary Refill: Capillary refill takes less than 2 seconds.     Coloration: Skin is not jaundiced or pale.  Neurological:     General: No focal deficit present.     Mental Status: He is alert and  oriented to person, place, and time.  Psychiatric:        Behavior: Behavior normal.      UC Treatments / Results  Labs (all labs ordered are listed, but only abnormal results are displayed) Labs Reviewed - No data to display  EKG   Radiology No results found.  Procedures Procedures (including critical care time)  Medications Ordered in UC Medications - No data to display  Initial Impression / Assessment and Plan / UC Course  I have reviewed the triage vital signs and the nursing notes.  Pertinent labs & imaging results that were available during my care of the patient were reviewed by me and considered in my medical decision making (see chart for details).        Renal dosing of paxlovid sent; I also sent in robitussin with codeine cough syrup. Final Clinical Impressions(s) / UC Diagnoses   Final diagnoses:  [id number]     Discharge Instructions      Take nirmatrelvir 150 mg --1 tablet twice daily for 5 days, plus also ritonavir 100 mg--1 tablet twice daily for 5 days.  These are antiviral medicines, meant to keep you from getting worse with a COVID-19 infection  Do not take your atorvastatin while taking this medication.  Take robitussin with codeine cough syrup--5 ml or 1 tsp every 6 hours as needed for cough.     ED Prescriptions     Medication Sig Dispense Auth. Provider   nirmatrelvir/ritonavir EUA, renal dosing, (PAXLOVID) 10 x 150 MG & 10 x '100MG'$  TABS Take nirmatrelvir (150 mg) one tablet twice daily for 5 days and ritonavir (100 mg) one tablet twice daily for 5 days 20 tablet Lennix Kneisel, Gwenlyn Perking, MD   guaiFENesin-codeine (CHERATUSSIN AC) 100-10 MG/5ML syrup Take 5 mLs by mouth 4 (four) times daily as needed for cough. 120 mL Barrett Henle, MD      I have reviewed the PDMP during this encounter.   Barrett Henle, MD 12/16/21 [phone]

## 2021-12-16 NOTE — ED Triage Notes (Signed)
Pt states he took an at home covid test and it was positive today. He has had cough, congestion. He has taken IBU and some left over cough meds from a previous px.

## 2021-12-16 NOTE — Telephone Encounter (Signed)
Pharmacy also out of tussionex. Hycodan generic sent

## 2021-12-16 NOTE — Discharge Instructions (Signed)
Take nirmatrelvir 150 mg --1 tablet twice daily for 5 days, plus also ritonavir 100 mg--1 tablet twice daily for 5 days.  These are antiviral medicines, meant to keep you from getting worse with a COVID-19 infection  Do not take your atorvastatin while taking this medication.  Take robitussin with codeine cough syrup--5 ml or 1 tsp every 6 hours as needed for cough.

## 2022-01-25 DIAGNOSIS — L814 Other melanin hyperpigmentation: Secondary | ICD-10-CM | POA: Diagnosis not present

## 2022-01-25 DIAGNOSIS — Z85828 Personal history of other malignant neoplasm of skin: Secondary | ICD-10-CM | POA: Diagnosis not present

## 2022-01-25 DIAGNOSIS — L821 Other seborrheic keratosis: Secondary | ICD-10-CM | POA: Diagnosis not present

## 2022-04-28 DIAGNOSIS — R519 Headache, unspecified: Secondary | ICD-10-CM | POA: Diagnosis not present

## 2022-04-28 DIAGNOSIS — H04123 Dry eye syndrome of bilateral lacrimal glands: Secondary | ICD-10-CM | POA: Diagnosis not present

## 2022-04-28 DIAGNOSIS — H25813 Combined forms of age-related cataract, bilateral: Secondary | ICD-10-CM | POA: Diagnosis not present

## 2022-04-28 DIAGNOSIS — H524 Presbyopia: Secondary | ICD-10-CM | POA: Diagnosis not present

## 2022-04-28 DIAGNOSIS — H40023 Open angle with borderline findings, high risk, bilateral: Secondary | ICD-10-CM | POA: Diagnosis not present

## 2022-08-15 DIAGNOSIS — N1832 Chronic kidney disease, stage 3b: Secondary | ICD-10-CM | POA: Diagnosis not present

## 2022-08-15 DIAGNOSIS — E782 Mixed hyperlipidemia: Secondary | ICD-10-CM | POA: Diagnosis not present

## 2022-08-15 DIAGNOSIS — Z125 Encounter for screening for malignant neoplasm of prostate: Secondary | ICD-10-CM | POA: Diagnosis not present

## 2022-08-15 DIAGNOSIS — I129 Hypertensive chronic kidney disease with stage 1 through stage 4 chronic kidney disease, or unspecified chronic kidney disease: Secondary | ICD-10-CM | POA: Diagnosis not present

## 2022-08-24 DIAGNOSIS — E782 Mixed hyperlipidemia: Secondary | ICD-10-CM | POA: Diagnosis not present

## 2022-08-24 DIAGNOSIS — R82998 Other abnormal findings in urine: Secondary | ICD-10-CM | POA: Diagnosis not present

## 2022-08-24 DIAGNOSIS — N1832 Chronic kidney disease, stage 3b: Secondary | ICD-10-CM | POA: Diagnosis not present

## 2022-08-24 DIAGNOSIS — Z0000 Encounter for general adult medical examination without abnormal findings: Secondary | ICD-10-CM | POA: Diagnosis not present

## 2022-08-24 DIAGNOSIS — I129 Hypertensive chronic kidney disease with stage 1 through stage 4 chronic kidney disease, or unspecified chronic kidney disease: Secondary | ICD-10-CM | POA: Diagnosis not present

## 2022-08-24 DIAGNOSIS — Z1331 Encounter for screening for depression: Secondary | ICD-10-CM | POA: Diagnosis not present

## 2022-08-24 DIAGNOSIS — R7301 Impaired fasting glucose: Secondary | ICD-10-CM | POA: Diagnosis not present

## 2022-08-24 DIAGNOSIS — Z1339 Encounter for screening examination for other mental health and behavioral disorders: Secondary | ICD-10-CM | POA: Diagnosis not present

## 2022-08-24 DIAGNOSIS — E663 Overweight: Secondary | ICD-10-CM | POA: Diagnosis not present

## 2022-10-24 IMAGING — MR MR HEAD WO/W CM
12 series · 48 of 48 positions shown · IV contrast (multihance)
Comparison: None.

CLINICAL DATA: Memory loss over the last 6 months.

EXAM:
MRI HEAD WITHOUT AND WITH CONTRAST
TECHNIQUE: Multiplanar, multiecho pulse sequences of the brain and surrounding
structures were obtained without and with intravenous contrast.
CONTRAST:  20mL MULTIHANCE GADOBENATE DIMEGLUMINE 529 MG/ML IV SOLN

[Series 3: T1 · sagittal · 5.0mm · 0.45mm/px · 1 of 23 slices shown]
[im 1/23]
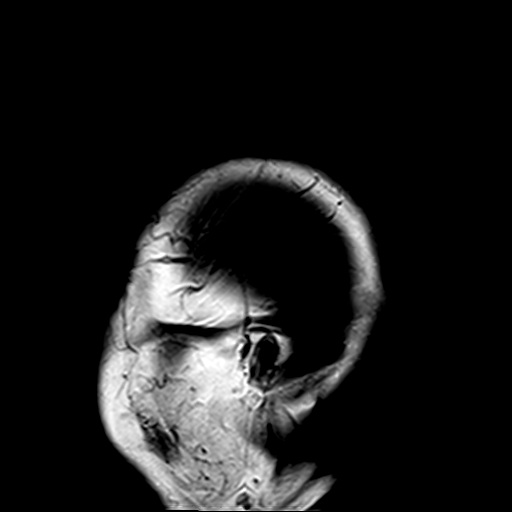

[Series 4: DWI · axial · 3.0mm · 1.80mm/px · z∈[-40,+106]mm · 7 of 102 slices shown (1 of 4)]
[im 1/102]
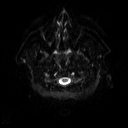
[im 17/102]
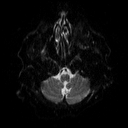
[im 34/102]
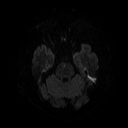
[im 51/102]
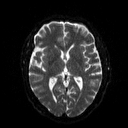
[im 68/102]
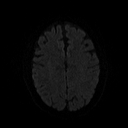
[im 85/102]
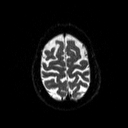
[im 102/102]
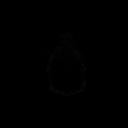

[Series 5: DWI · axial · 3.0mm · 1.80mm/px · z∈[-40,+106]mm · 3 of 52 slices shown (2 of 4)]
[im 1/52]
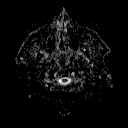
[im 26/52]
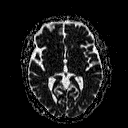
[im 52/52]
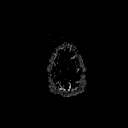

[Series 6: DWI · coronal · 5.0mm · 1.80mm/px · 5 of 74 slices shown (3 of 4)]
[im 1/74]
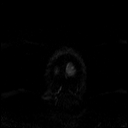
[im 19/74]
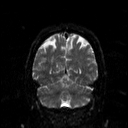
[im 37/74]
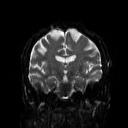
[im 55/74]
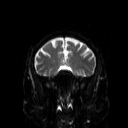
[im 74/74]
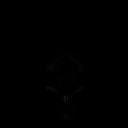

[Series 7: DWI · coronal · 5.0mm · 1.80mm/px · 2 of 37 slices shown (4 of 4)]
[im 1/37]
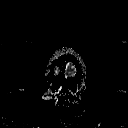
[im 37/37]
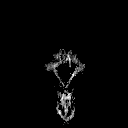

[Series 8: T2 · axial · 5.0mm · 0.60mm/px · z∈[-40,+108]mm · 2 of 24 slices shown (1 of 2)]
[im 1/24]
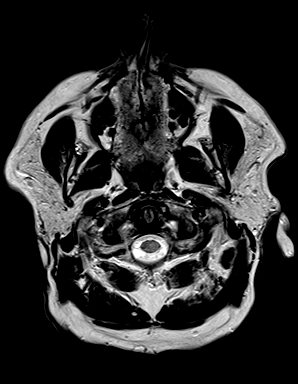
[im 24/24]
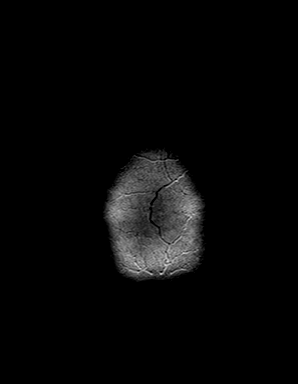

[Series 9: FLAIR · axial · 3.0mm · 0.45mm/px · z∈[-31,+97]mm · 2 of 30 slices shown]
[im 1/30]
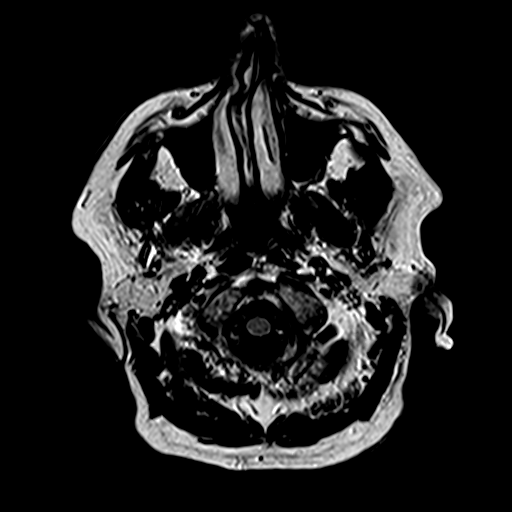
[im 30/30]
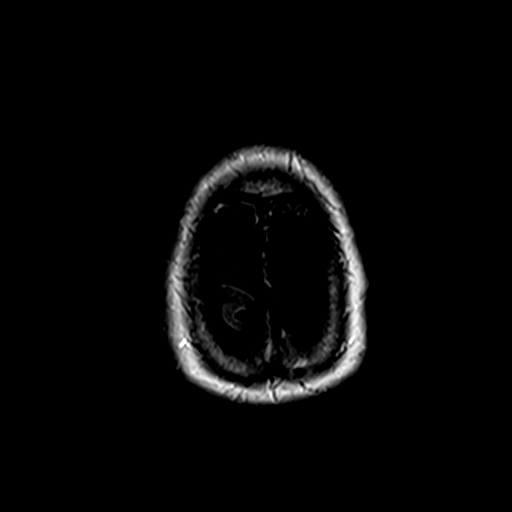

[Series 11: swi_images · axial · 4.0mm · 0.90mm/px · z∈[-33,+100]mm · 2 of 36 slices shown]
[im 1/36]
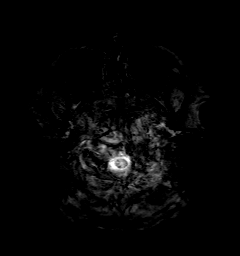
[im 36/36]
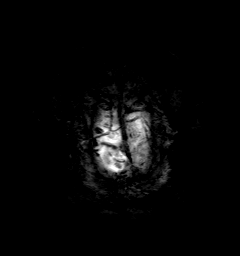

[Series 12: t1_mpr_tra · axial · 1.0mm · 0.75mm/px · z∈[-32,+104]mm · 10 of 144 slices shown]
[im 1/144]
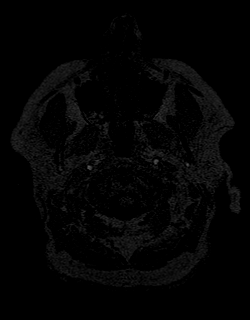
[im 16/144]
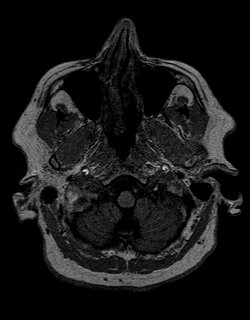
[im 32/144]
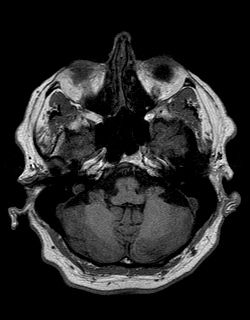
[im 48/144]
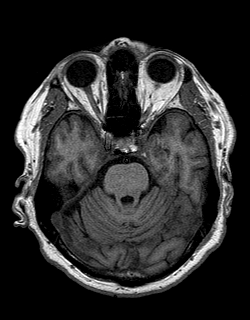
[im 64/144]
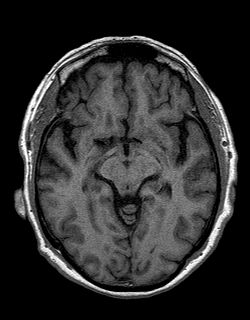
[im 80/144]
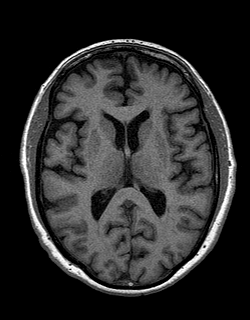
[im 96/144]
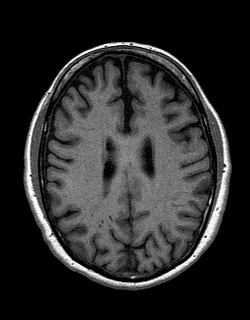
[im 112/144]
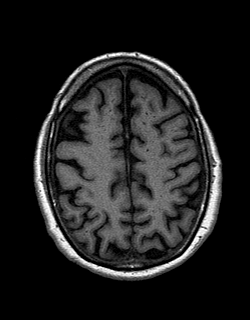
[im 128/144]
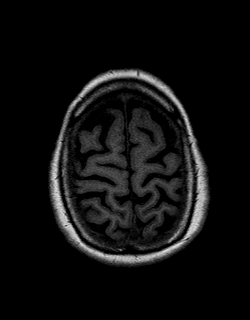
[im 144/144]
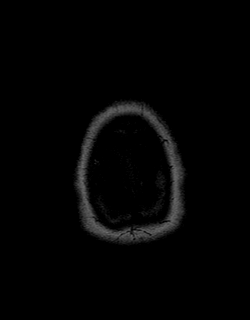

[Series 13: T2 · coronal · 5.0mm · 0.45mm/px · 2 of 29 slices shown (2 of 2)]
[im 1/29]
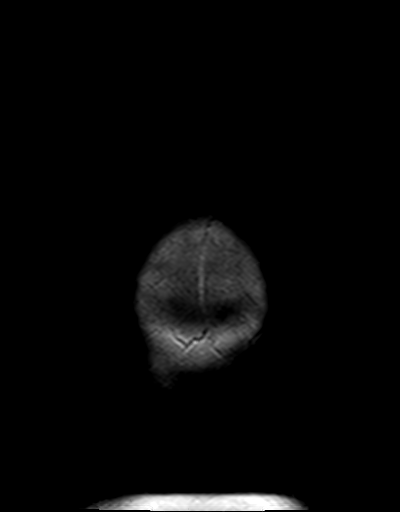
[im 29/29]
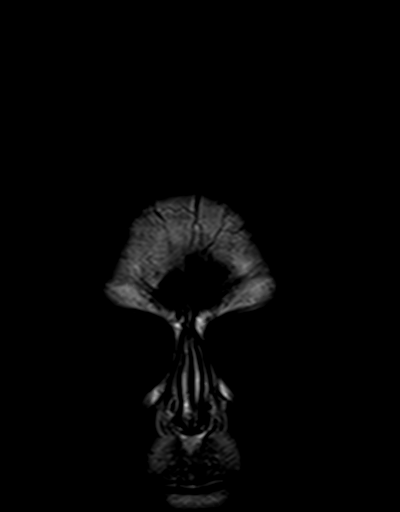

[Series 14: t1_mpr_tra post · axial · 1.0mm · 0.75mm/px · z∈[-32,+104]mm · 10 of 144 slices shown]
[im 1/144]
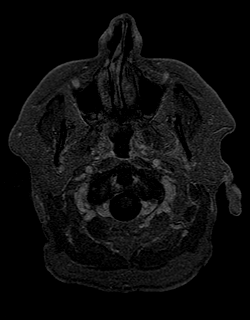
[im 16/144]
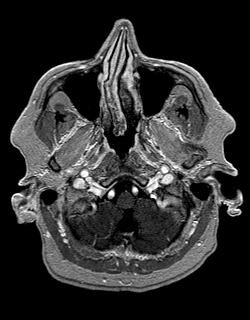
[im 32/144]
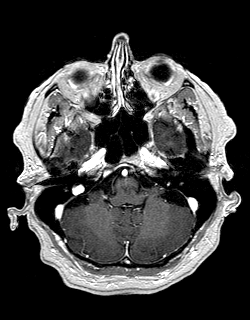
[im 48/144]
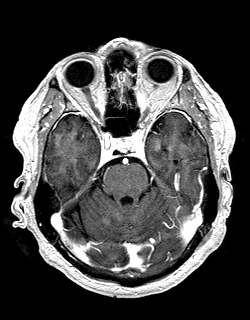
[im 64/144]
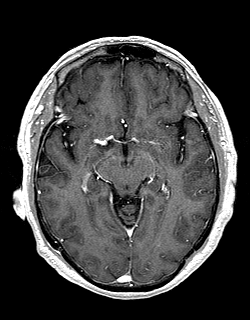
[im 80/144]
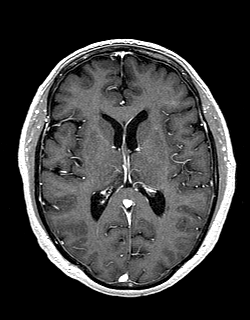
[im 96/144]
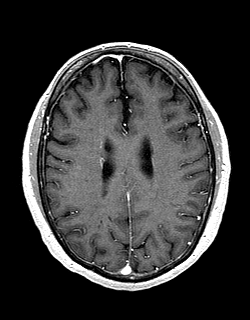
[im 112/144]
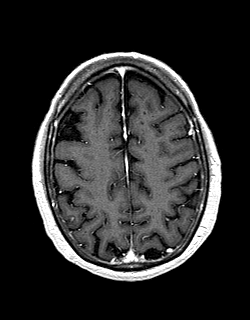
[im 128/144]
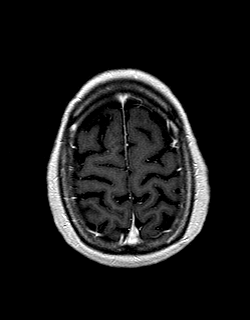
[im 144/144]
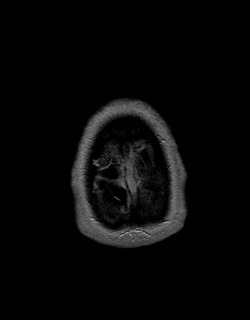

[Series 15: post cor · coronal · 5.0mm · 0.45mm/px · 2 of 29 slices shown]
[im 1/29]
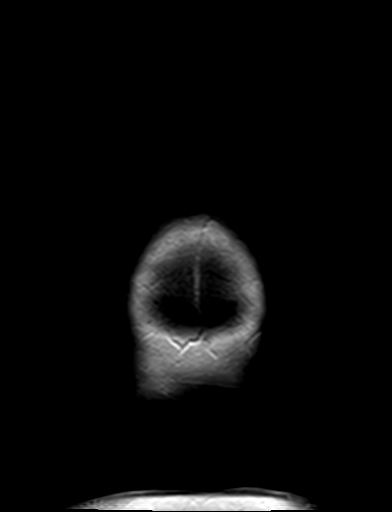
[im 29/29]
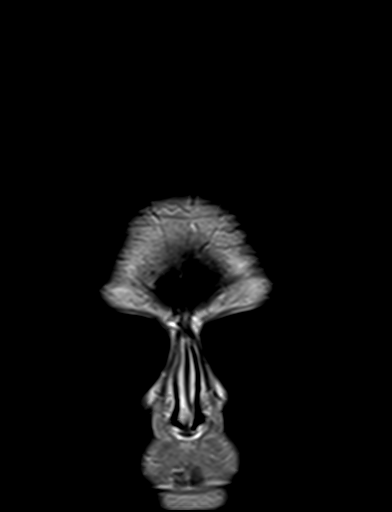

[48 of 48 positions shown; findings below may reference images not displayed]

FINDINGS: Brain: Diffusion imaging does not show any acute or subacute
infarction. No focal abnormality affects the brainstem or
cerebellum. Cerebral hemispheres show mild age related volume loss
with only a few small foci of T2 and FLAIR signal within the
hemispheric white matter, less than often seen at this age. No
cortical or large vessel territory infarction. No mass lesion,
hemorrhage, hydrocephalus or extra-axial collection. After contrast
administration, no abnormal enhancement occurs.

Vascular: Major vessels at the base of the brain show flow.

Skull and upper cervical spine: Negative

Sinuses/Orbits: Clear/normal

Other: None
IMPRESSION: No acute or reversible finding. Mild age related volume loss with
only a few small foci of T2 and FLAIR signal within the hemispheric
white matter, less than often seen at this age.

## 2023-01-26 DIAGNOSIS — L570 Actinic keratosis: Secondary | ICD-10-CM | POA: Diagnosis not present

## 2023-01-26 DIAGNOSIS — Z85828 Personal history of other malignant neoplasm of skin: Secondary | ICD-10-CM | POA: Diagnosis not present

## 2023-01-26 DIAGNOSIS — L905 Scar conditions and fibrosis of skin: Secondary | ICD-10-CM | POA: Diagnosis not present

## 2023-01-26 DIAGNOSIS — L821 Other seborrheic keratosis: Secondary | ICD-10-CM | POA: Diagnosis not present

## 2023-03-23 DIAGNOSIS — M488X2 Other specified spondylopathies, cervical region: Secondary | ICD-10-CM | POA: Diagnosis not present

## 2023-03-23 DIAGNOSIS — K115 Sialolithiasis: Secondary | ICD-10-CM | POA: Diagnosis not present

## 2023-03-23 DIAGNOSIS — M542 Cervicalgia: Secondary | ICD-10-CM | POA: Diagnosis not present

## 2023-03-23 DIAGNOSIS — I10 Essential (primary) hypertension: Secondary | ICD-10-CM | POA: Diagnosis not present

## 2023-05-01 DIAGNOSIS — H40023 Open angle with borderline findings, high risk, bilateral: Secondary | ICD-10-CM | POA: Diagnosis not present

## 2023-05-01 DIAGNOSIS — H25813 Combined forms of age-related cataract, bilateral: Secondary | ICD-10-CM | POA: Diagnosis not present

## 2023-05-01 DIAGNOSIS — H524 Presbyopia: Secondary | ICD-10-CM | POA: Diagnosis not present

## 2023-05-01 DIAGNOSIS — Q141 Congenital malformation of retina: Secondary | ICD-10-CM | POA: Diagnosis not present

## 2023-08-21 DIAGNOSIS — Z125 Encounter for screening for malignant neoplasm of prostate: Secondary | ICD-10-CM | POA: Diagnosis not present

## 2023-08-21 DIAGNOSIS — N1832 Chronic kidney disease, stage 3b: Secondary | ICD-10-CM | POA: Diagnosis not present

## 2023-08-21 DIAGNOSIS — E782 Mixed hyperlipidemia: Secondary | ICD-10-CM | POA: Diagnosis not present

## 2023-08-21 DIAGNOSIS — I129 Hypertensive chronic kidney disease with stage 1 through stage 4 chronic kidney disease, or unspecified chronic kidney disease: Secondary | ICD-10-CM | POA: Diagnosis not present

## 2023-08-21 DIAGNOSIS — R7301 Impaired fasting glucose: Secondary | ICD-10-CM | POA: Diagnosis not present

## 2023-08-28 DIAGNOSIS — R82998 Other abnormal findings in urine: Secondary | ICD-10-CM | POA: Diagnosis not present

## 2023-08-28 DIAGNOSIS — E782 Mixed hyperlipidemia: Secondary | ICD-10-CM | POA: Diagnosis not present

## 2023-08-28 DIAGNOSIS — Z1331 Encounter for screening for depression: Secondary | ICD-10-CM | POA: Diagnosis not present

## 2023-08-28 DIAGNOSIS — N1832 Chronic kidney disease, stage 3b: Secondary | ICD-10-CM | POA: Diagnosis not present

## 2023-08-28 DIAGNOSIS — E1169 Type 2 diabetes mellitus with other specified complication: Secondary | ICD-10-CM | POA: Diagnosis not present

## 2023-08-28 DIAGNOSIS — I129 Hypertensive chronic kidney disease with stage 1 through stage 4 chronic kidney disease, or unspecified chronic kidney disease: Secondary | ICD-10-CM | POA: Diagnosis not present

## 2023-08-28 DIAGNOSIS — Z0000 Encounter for general adult medical examination without abnormal findings: Secondary | ICD-10-CM | POA: Diagnosis not present

## 2023-08-28 DIAGNOSIS — I10 Essential (primary) hypertension: Secondary | ICD-10-CM | POA: Diagnosis not present

## 2023-08-28 DIAGNOSIS — Z1339 Encounter for screening examination for other mental health and behavioral disorders: Secondary | ICD-10-CM | POA: Diagnosis not present

## 2023-12-21 DIAGNOSIS — E782 Mixed hyperlipidemia: Secondary | ICD-10-CM | POA: Diagnosis not present

## 2023-12-21 DIAGNOSIS — I129 Hypertensive chronic kidney disease with stage 1 through stage 4 chronic kidney disease, or unspecified chronic kidney disease: Secondary | ICD-10-CM | POA: Diagnosis not present

## 2023-12-21 DIAGNOSIS — G4739 Other sleep apnea: Secondary | ICD-10-CM | POA: Diagnosis not present

## 2023-12-21 DIAGNOSIS — E1169 Type 2 diabetes mellitus with other specified complication: Secondary | ICD-10-CM | POA: Diagnosis not present

## 2023-12-21 DIAGNOSIS — N1832 Chronic kidney disease, stage 3b: Secondary | ICD-10-CM | POA: Diagnosis not present

## 2024-01-18 ENCOUNTER — Other Ambulatory Visit: Payer: Self-pay | Admitting: Family Medicine

## 2024-01-18 DIAGNOSIS — M488X2 Other specified spondylopathies, cervical region: Secondary | ICD-10-CM

## 2024-01-29 ENCOUNTER — Inpatient Hospital Stay: Admission: RE | Admit: 2024-01-29 | Discharge: 2024-01-29 | Attending: Family Medicine | Admitting: Family Medicine

## 2024-01-29 DIAGNOSIS — M488X2 Other specified spondylopathies, cervical region: Secondary | ICD-10-CM
